# Patient Record
Sex: Male | Born: 1990 | Race: Black or African American | Hispanic: No | Marital: Married | Smoking: Current every day smoker
Health system: Southern US, Community
[De-identification: ages and names within clinical notes are randomized; demographics above are authoritative.]

---

## 2009-03-11 ENCOUNTER — Emergency Department (HOSPITAL_COMMUNITY): Admission: EM | Admit: 2009-03-11 | Discharge: 2009-03-11 | Payer: Self-pay | Admitting: Emergency Medicine

## 2010-10-26 ENCOUNTER — Emergency Department (HOSPITAL_COMMUNITY)
Admission: EM | Admit: 2010-10-26 | Discharge: 2010-10-26 | Disposition: A | Discharge status: Home / Self Care | Payer: No Typology Code available for payment source | Attending: Emergency Medicine | Admitting: Emergency Medicine

## 2010-10-26 ENCOUNTER — Emergency Department (HOSPITAL_COMMUNITY): Payer: No Typology Code available for payment source

## 2010-10-26 DIAGNOSIS — T148XXA Other injury of unspecified body region, initial encounter: Secondary | ICD-10-CM | POA: Insufficient documentation

## 2010-10-26 DIAGNOSIS — R079 Chest pain, unspecified: Secondary | ICD-10-CM | POA: Insufficient documentation

## 2011-06-26 ENCOUNTER — Emergency Department (HOSPITAL_COMMUNITY): Payer: Self-pay

## 2011-06-26 ENCOUNTER — Emergency Department (HOSPITAL_COMMUNITY)
Admission: EM | Admit: 2011-06-26 | Discharge: 2011-06-26 | Disposition: A | Discharge status: Home / Self Care | Payer: Self-pay | Attending: Emergency Medicine | Admitting: Emergency Medicine

## 2011-06-26 ENCOUNTER — Encounter: Payer: Self-pay | Admitting: *Deleted

## 2011-06-26 DIAGNOSIS — S02400A Malar fracture unspecified, initial encounter for closed fracture: Secondary | ICD-10-CM | POA: Insufficient documentation

## 2011-06-26 DIAGNOSIS — S02402A Zygomatic fracture, unspecified, initial encounter for closed fracture: Secondary | ICD-10-CM

## 2011-06-26 DIAGNOSIS — IMO0002 Reserved for concepts with insufficient information to code with codable children: Secondary | ICD-10-CM | POA: Insufficient documentation

## 2011-06-26 DIAGNOSIS — R51 Headache: Secondary | ICD-10-CM | POA: Insufficient documentation

## 2011-06-26 DIAGNOSIS — S02401A Maxillary fracture, unspecified, initial encounter for closed fracture: Secondary | ICD-10-CM | POA: Insufficient documentation

## 2011-06-26 MED ORDER — HYDROCODONE-ACETAMINOPHEN 5-325 MG PO TABS: 2.0000 | ORAL_TABLET | ORAL | Status: AC | PRN

## 2011-06-26 MED ORDER — METHOCARBAMOL 500 MG PO TABS: 500.0000 mg | ORAL_TABLET | Freq: Two times a day (BID) | ORAL | Status: AC

## 2011-06-26 NOTE — ED Notes (Signed)
Pt states "was hit in the face by a baseball yesterday around 1800, it didn't knock me out, I have an indention, I can't eat"; pt presents with bruising under left eye, edema to left jaw, indention visible.

## 2011-06-26 NOTE — ED Provider Notes (Signed)
History     CSN: [id number] Arrival date & time: 06/26/2011  4:21 PM   First MD Initiated Contact with Patient 06/26/11 1722      Chief Complaint  Patient presents with  . Facial Injury    (Consider location/radiation/quality/duration/timing/severity/associated sxs/prior treatment) HPI  History reviewed. No pertinent past medical history.  History reviewed. No pertinent past surgical history.  No family history on file.  History  Substance Use Topics  . Smoking status: Former Games developer  . Smokeless tobacco: Not on file  . Alcohol Use: No      Review of Systems  All other systems reviewed and are negative.    Allergies  Review of patient's allergies indicates no known allergies.  Home Medications   Current Outpatient Rx  Name Route Sig Dispense Refill  . IBUPROFEN 200 MG PO TABS Oral Take 200 mg by mouth every 6 (six) hours as needed.        BP 143/95  Pulse 59  Temp(Src) 98.4 F (36.9 C) (Oral)  Resp 17  Wt 160 lb (72.576 kg)  SpO2 100%  Physical Exam  Nursing note and vitals reviewed. Constitutional: He is oriented to person, place, and time. He appears well-developed and well-nourished.  Non-toxic appearance. No distress.  HENT:  Head:    Eyes: Conjunctivae, EOM and lids are normal. Pupils are equal, round, and reactive to light.  Neck: Normal range of motion. Neck supple. No tracheal deviation present. No mass present.  Cardiovascular: Normal rate, regular rhythm and normal heart sounds.  Exam reveals no gallop.   No murmur heard. Pulmonary/Chest: Effort normal and breath sounds normal. No stridor. No respiratory distress. He has no decreased breath sounds. He has no wheezes. He has no rhonchi. He has no rales.  Abdominal: Soft. Normal appearance and bowel sounds are normal. He exhibits no distension. There is no tenderness. There is no rebound and no CVA tenderness.  Musculoskeletal: Normal range of motion. He exhibits no edema and no tenderness.    Neurological: He is alert and oriented to person, place, and time. He has normal strength. No cranial nerve deficit or sensory deficit. GCS eye subscore is 4. GCS verbal subscore is 5. GCS motor subscore is 6.  Skin: Skin is warm and dry. No abrasion and no rash noted.  Psychiatric: He has a normal mood and affect. His speech is normal and behavior is normal.    ED Course  Procedures (including critical care time)  Labs Reviewed - No data to display No results found.   No diagnosis found.    MDM  The patient's x-rays reviewed and patient given referral to maxillofacial surgeon on call        Toy Baker, MD 06/26/11 1824

## 2011-06-26 NOTE — ED Notes (Signed)
Dr. Patria Mane notified

## 2012-09-04 IMAGING — CT CT MAXILLOFACIAL W/O CM
3 series · 14 of 47 positions shown, 16 images · non-contrast
Comparison: None.

CLINICAL DATA: Left facial injury.  Pain.

CT MAXILLOFACIAL WITHOUT CONTRAST
TECHNIQUE: Multidetector CT imaging of the maxillofacial
structures was performed. Multiplanar CT image reconstructions were
also generated.

[Series 2: facial st · axial · 0.38mm/px · z∈[-524,-388]mm · 8 of 80 slices shown, 10 images]
[im 6/80  brain]
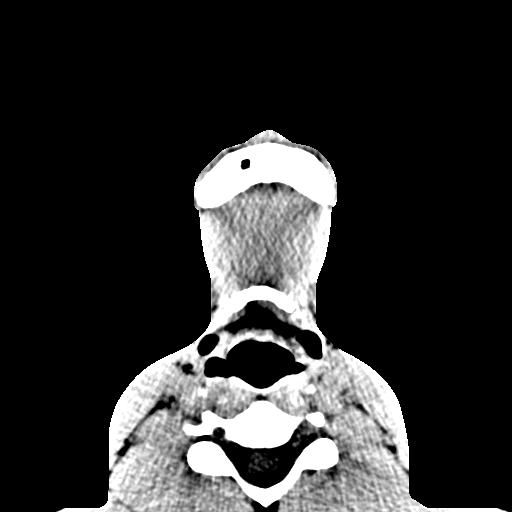
[im 6/80  bone]
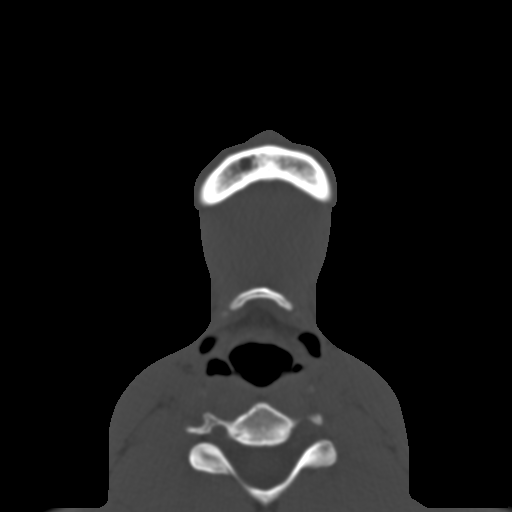
[im 17/80  bone]
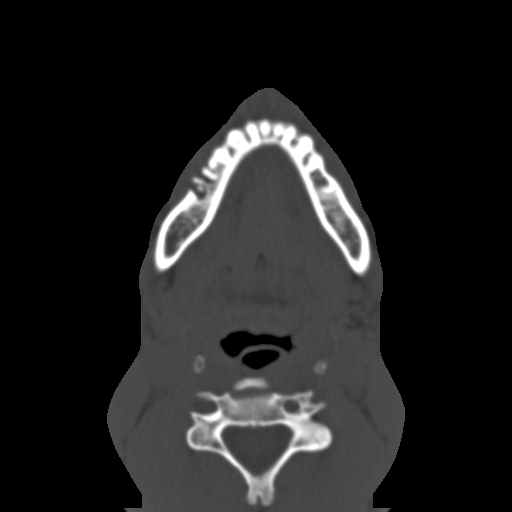
[im 25/80  bone]
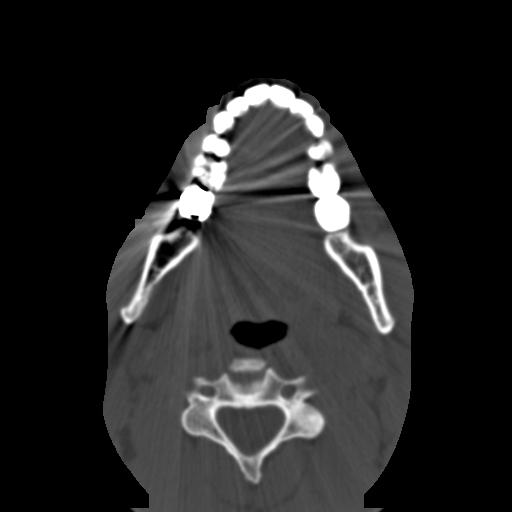
[im 36/80  bone]
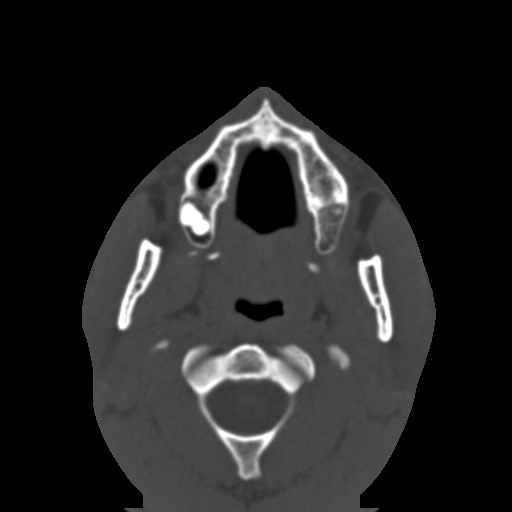
[im 44/80  brain]
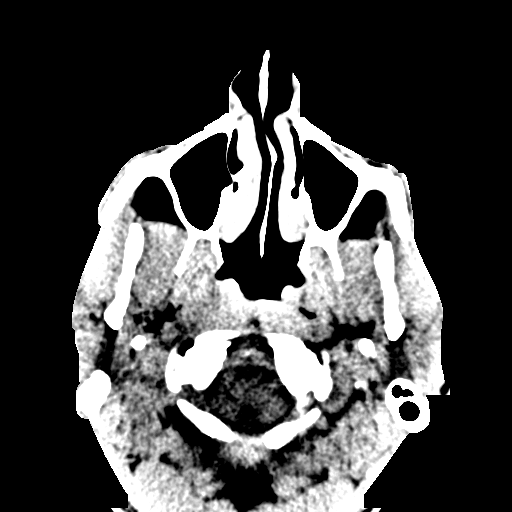
[im 44/80  bone]
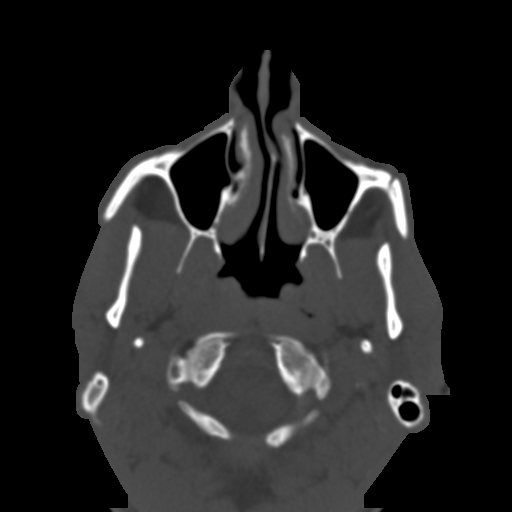
[im 55/80  bone]
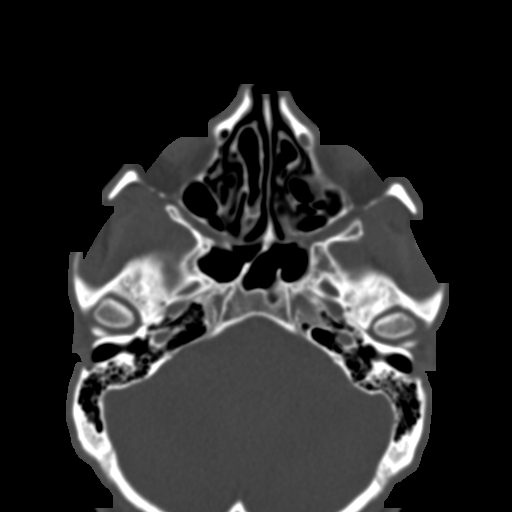
[im 63/80  bone]
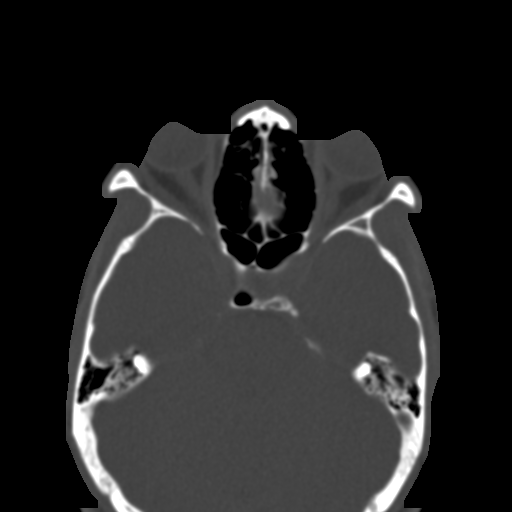
[im 74/80  bone]
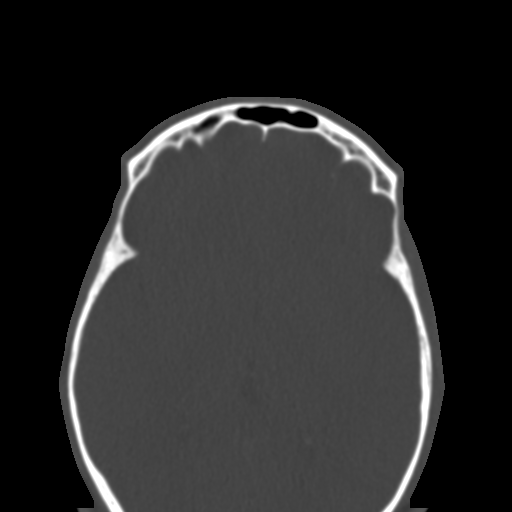

[Series 6: coronal st · coronal · 0.31mm/px · 3 of 76 slices shown]
[im 26/76  bone]
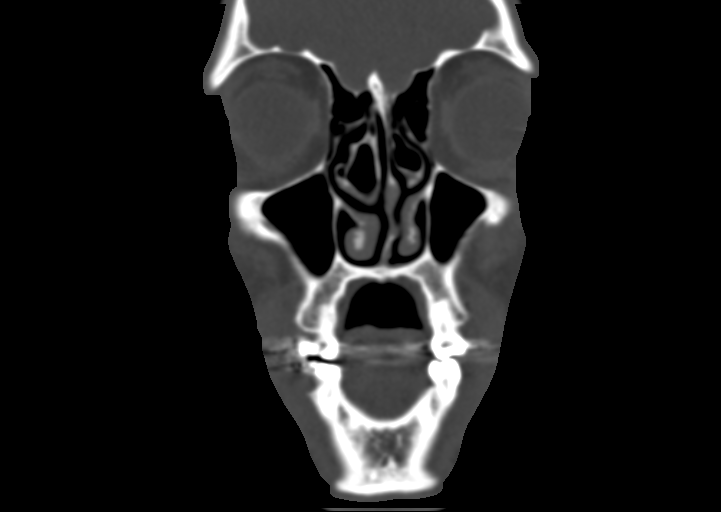
[im 34/76  bone]
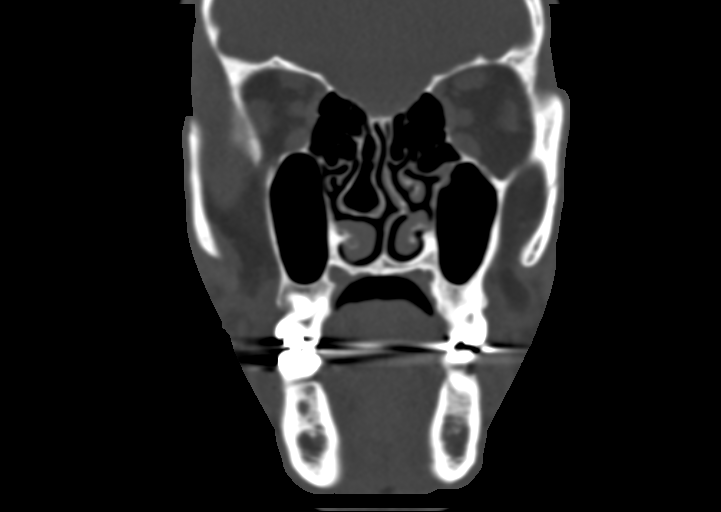
[im 42/76  bone]
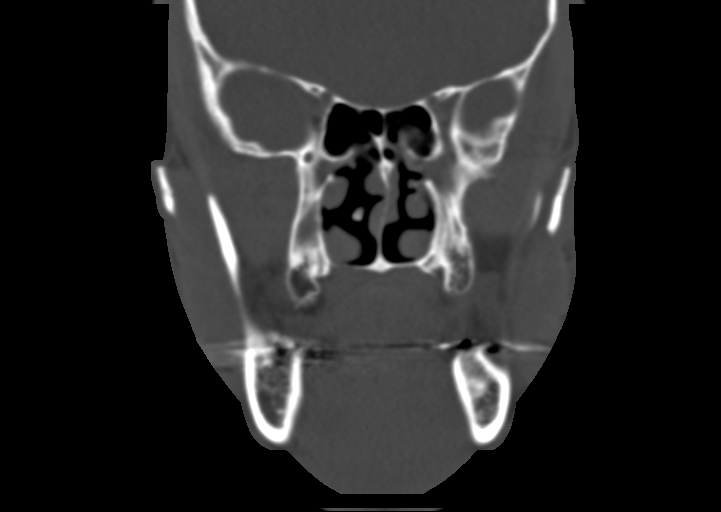

[Series 7: sagittal st · sagittal · 0.31mm/px · 3 of 78 slices shown]
[im 26/78  bone]
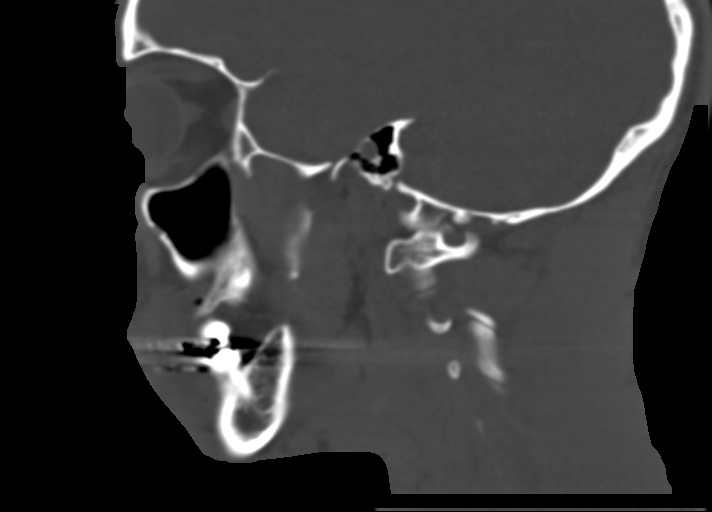
[im 39/78  bone]
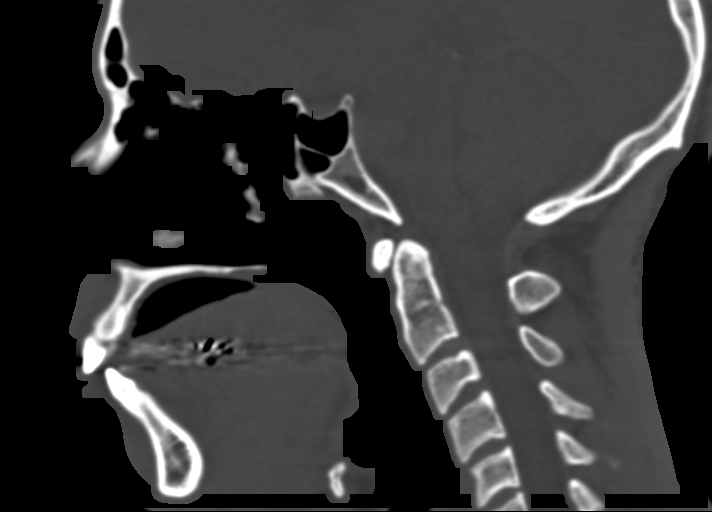
[im 52/78  bone]
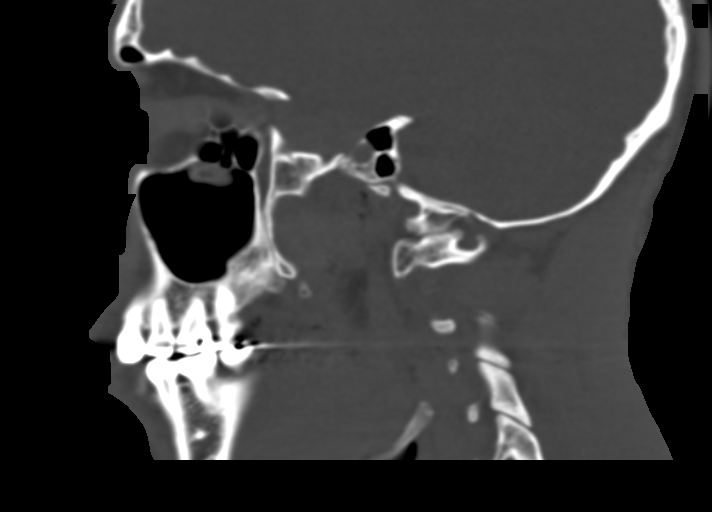

[14 of 47 positions shown; findings below may reference images not displayed]

FINDINGS: There is a mildly comminuted and depressed fracture of
the left zygomatic arch.  There is no associated lateral orbital
fracture.  No other facial fractures are identified.  The paranasal
sinuses are clear without air fluid levels.  The mastoids and
middle ears are clear.  There is no evidence of orbital hematoma.

Mild left facial soft tissue swelling is present.  Bilateral contra
bullosa are noted incidentally.
IMPRESSION: Mildly depressed and comminuted fracture of the left zygomatic arch
as described.  No evidence of associated orbital fracture or
orbital hematoma.

## 2021-07-21 ENCOUNTER — Encounter (HOSPITAL_COMMUNITY): Payer: Self-pay

## 2021-07-21 ENCOUNTER — Ambulatory Visit (HOSPITAL_COMMUNITY)
Admission: EM | Admit: 2021-07-21 | Discharge: 2021-07-21 | Disposition: A | Discharge status: Home / Self Care | Payer: Self-pay | Attending: Student | Admitting: Student

## 2021-07-21 ENCOUNTER — Other Ambulatory Visit: Payer: Self-pay

## 2021-07-21 DIAGNOSIS — J111 Influenza due to unidentified influenza virus with other respiratory manifestations: Secondary | ICD-10-CM

## 2021-07-21 LAB — POC INFLUENZA A AND B ANTIGEN (URGENT CARE ONLY)
INFLUENZA A ANTIGEN, POC: NEGATIVE
INFLUENZA B ANTIGEN, POC: NEGATIVE

## 2021-07-21 MED ORDER — OSELTAMIVIR PHOSPHATE 75 MG PO CAPS: 75.0000 mg | ORAL_CAPSULE | Freq: Two times a day (BID) | ORAL | 0 refills | Status: AC

## 2021-07-21 MED ORDER — ONDANSETRON 8 MG PO TBDP: 8.0000 mg | ORAL_TABLET | Freq: Three times a day (TID) | ORAL | 0 refills | Status: AC | PRN

## 2021-07-21 NOTE — Discharge Instructions (Addendum)
-  Tamiflu twice daily x5 days. This medication can cause nausea, so I also sent nausea medication. You can stop the Tamiflu if you don't like it or if it causes side effects.  -Take the Zofran (ondansetron) up to 3 times daily for nausea and vomiting. -For fevers/chills, bodyaches, headaches- You can take Tylenol up to 1000 mg 3 times daily, and ibuprofen up to 600 mg 3 times daily with food.  You can take these together, or alternate every 3-4 hours. -Drink plenty of water/gatorade and get plenty of rest -With a virus, you're typically contagious for 5-7 days, or as long as you're having fevers.  -Come back and see us if things are getting worse instead of better, like shortness of breath, chest pain, fevers and chills that are getting higher instead of lower and do not come down with Tylenol or ibuprofen, etc.  

## 2021-07-21 NOTE — ED Triage Notes (Signed)
Pt presents with leg pain,chills and fever that started yesterday at work.

## 2021-07-21 NOTE — ED Provider Notes (Signed)
[place]    CSN: [id number] Arrival date & time: 07/21/21  0945      History   Chief Complaint Chief Complaint  Patient presents with   Leg Pain    CHILLS X 2 DAYS.    HPI Samuel Marks is a 30 y.o. male presenting with viral syndrome x1.5 days.  Medical history noncontributory, denies history of pulmonary disease.  Describes generalized myalgias worse over the legs, subjective chills. Denies known sick contacts  Hasn't tried medications at home yet. Generalized bodyaches.  Denies nausea, vomiting, diarrhea, chest pain, dizziness, shortness of breath, weakness.  HPI  History reviewed. No pertinent past medical history.  There are no problems to display for this patient.   History reviewed. No pertinent surgical history.     Home Medications    Prior to Admission medications   Medication Sig Start Date End Date Taking? Authorizing Provider  ondansetron (ZOFRAN-ODT) 8 MG disintegrating tablet Take 1 tablet (8 mg total) by mouth every 8 (eight) hours as needed for nausea or vomiting. 07/21/21  Yes Hazel Sams, PA-C  oseltamivir (TAMIFLU) 75 MG capsule Take 1 capsule (75 mg total) by mouth every 12 (twelve) hours. 07/21/21  Yes Hazel Sams, PA-C  acetaminophen (TYLENOL) 500 MG tablet Take 500 mg by mouth every 6 (six) hours as needed. pain     [provider]    Family History History reviewed. No pertinent family history.  Social History Social History   Tobacco Use   Smoking status: Former  Substance Use Topics   Alcohol use: No   Drug use: Yes    Types: Marijuana     Allergies   Patient has no known allergies.   Review of Systems Review of Systems  Constitutional:  Negative for appetite change, chills and fever.  HENT:  Positive for congestion. Negative for ear pain, rhinorrhea, sinus pressure, sinus pain and sore throat.   Eyes:  Negative for redness and visual disturbance.  Respiratory:  Positive for cough. Negative  for chest tightness, shortness of breath and wheezing.   Cardiovascular:  Negative for chest pain and palpitations.  Gastrointestinal:  Negative for abdominal pain, constipation, diarrhea, nausea and vomiting.  Genitourinary:  Negative for dysuria, frequency and urgency.  Musculoskeletal:  Positive for myalgias.  Neurological:  Negative for dizziness, weakness and headaches.  Psychiatric/Behavioral:  Negative for confusion.   All other systems reviewed and are negative.   Physical Exam Triage Vital Signs ED Triage Vitals  Enc Vitals Group     BP 07/21/21 1037 132/81     Pulse Rate 07/21/21 1037 78     Resp 07/21/21 1037 16     Temp 07/21/21 1037 99.1 F (37.3 C)     Temp Source 07/21/21 1037 Oral     SpO2 07/21/21 1037 100 %     Weight --      Height --      Head Circumference --      Peak Flow --      Pain Score 07/21/21 1038 6     Pain Loc --      Pain Edu? --      Excl. in [place]? --    No data found.  Updated Vital Signs BP 132/81 (BP Location: Left Arm)    Pulse 78    Temp 99.1 F (37.3 C) (Oral)    Resp 16    SpO2 100%   Visual Acuity Right Eye Distance:   Left Eye Distance:  Bilateral Distance:    Right Eye Near:   Left Eye Near:    Bilateral Near:     Physical Exam Vitals reviewed.  Constitutional:      General: He is not in acute distress.    Appearance: Normal appearance. He is not ill-appearing.  HENT:     Head: Normocephalic and atraumatic.     Right Ear: Tympanic membrane, ear canal and external ear normal. No tenderness. No middle ear effusion. There is no impacted cerumen. Tympanic membrane is not perforated, erythematous, retracted or bulging.     Left Ear: Tympanic membrane, ear canal and external ear normal. No tenderness.  No middle ear effusion. There is no impacted cerumen. Tympanic membrane is not perforated, erythematous, retracted or bulging.     Nose: Congestion present.     Mouth/Throat:     Mouth: Mucous membranes are moist.      Pharynx: Uvula midline. No oropharyngeal exudate or posterior oropharyngeal erythema.  Eyes:     Extraocular Movements: Extraocular movements intact.     Pupils: Pupils are equal, round, and reactive to light.  Cardiovascular:     Rate and Rhythm: Normal rate and regular rhythm.     Heart sounds: Normal heart sounds.  Pulmonary:     Effort: Pulmonary effort is normal.     Breath sounds: Normal breath sounds. No decreased breath sounds, wheezing, rhonchi or rales.  Abdominal:     Palpations: Abdomen is soft.     Tenderness: There is no abdominal tenderness. There is no guarding or rebound.  Lymphadenopathy:     Cervical: No cervical adenopathy.     Right cervical: No superficial cervical adenopathy.    Left cervical: No superficial cervical adenopathy.  Neurological:     General: No focal deficit present.     Mental Status: He is alert and oriented to person, place, and time.  Psychiatric:        Mood and Affect: Mood normal.        Behavior: Behavior normal.        Thought Content: Thought content normal.        Judgment: Judgment normal.     UC Treatments / Results  Labs (all labs ordered are listed, but only abnormal results are displayed) Labs Reviewed  POC INFLUENZA A AND B ANTIGEN (URGENT CARE ONLY)  POC INFLUENZA A AND B ANTIGEN (URGENT CARE ONLY)    EKG   Radiology No results found.  Procedures Procedures (including critical care time)  Medications Ordered in UC Medications - No data to display  Initial Impression / Assessment and Plan / UC Course  I have reviewed the triage vital signs and the nursing notes.  Pertinent labs & imaging results that were available during my care of the patient were reviewed by me and considered in my medical decision making (see chart for details).     This patient is a very pleasant 30 y.o. year old male presenting with suspected influenza. Today this pt is afebrile nontachycardic nontachypneic, oxygenating well on room  air, no wheezes rhonchi or rales. No history cardiopulm ds.  Rapid influenza negative, suspect this was a false negative - too early during course of illness . Tamiflu and zofran sent to have on hand. Good hydration.  Work note provided. ED return precautions discussed. Patient verbalizes understanding and agreement.   Final Clinical Impressions(s) / UC Diagnoses   Final diagnoses:  Influenza with respiratory manifestation     Discharge Instructions      -  Tamiflu twice daily x5 days. This medication can cause nausea, so I also sent nausea medication. You can stop the Tamiflu if you don't like it or if it causes side effects.  -Take the Zofran (ondansetron) up to 3 times daily for nausea and vomiting. -For fevers/chills, bodyaches, headaches- You can take Tylenol up to 1000 mg 3 times daily, and ibuprofen up to 600 mg 3 times daily with food.  You can take these together, or alternate every 3-4 hours. -Drink plenty of water/gatorade and get plenty of rest -With a virus, you're typically contagious for 5-7 days, or as long as you're having fevers.  -Come back and see [place] if things are getting worse instead of better, like shortness of breath, chest pain, fevers and chills that are getting higher instead of lower and do not come down with Tylenol or ibuprofen, etc.      ED Prescriptions     Medication Sig Dispense Auth. Provider   oseltamivir (TAMIFLU) 75 MG capsule Take 1 capsule (75 mg total) by mouth every 12 (twelve) hours. 10 capsule Marin Roberts E, PA-C   ondansetron (ZOFRAN-ODT) 8 MG disintegrating tablet Take 1 tablet (8 mg total) by mouth every 8 (eight) hours as needed for nausea or vomiting. 20 tablet Hazel Sams, PA-C      PDMP not reviewed this encounter.   Hazel Sams, PA-C 07/21/21 1115

## 2023-04-24 ENCOUNTER — Emergency Department (HOSPITAL_COMMUNITY): Payer: Medicaid Other

## 2023-04-24 ENCOUNTER — Emergency Department (HOSPITAL_COMMUNITY)
Admission: EM | Admit: 2023-04-24 | Discharge: 2023-04-24 | Discharge status: Against Medical Advice | Payer: Medicaid Other | Attending: Emergency Medicine | Admitting: Emergency Medicine

## 2023-04-24 ENCOUNTER — Other Ambulatory Visit: Payer: Self-pay

## 2023-04-24 ENCOUNTER — Encounter (HOSPITAL_COMMUNITY): Payer: Self-pay

## 2023-04-24 DIAGNOSIS — E876 Hypokalemia: Secondary | ICD-10-CM | POA: Diagnosis not present

## 2023-04-24 DIAGNOSIS — K6289 Other specified diseases of anus and rectum: Secondary | ICD-10-CM | POA: Diagnosis not present

## 2023-04-24 DIAGNOSIS — R9431 Abnormal electrocardiogram [ECG] [EKG]: Secondary | ICD-10-CM | POA: Diagnosis not present

## 2023-04-24 DIAGNOSIS — R0781 Pleurodynia: Secondary | ICD-10-CM | POA: Diagnosis not present

## 2023-04-24 DIAGNOSIS — R0789 Other chest pain: Secondary | ICD-10-CM | POA: Diagnosis not present

## 2023-04-24 LAB — COMPREHENSIVE METABOLIC PANEL
ALT: 18 U/L (ref 0–44)
AST: 20 U/L (ref 15–41)
Albumin: 4 g/dL (ref 3.5–5.0)
Alkaline Phosphatase: 53 U/L (ref 38–126)
Anion gap: 14 (ref 5–15)
BUN: 9 mg/dL (ref 6–20)
CO2: 22 mmol/L (ref 22–32)
Calcium: 8.9 mg/dL (ref 8.9–10.3)
Chloride: 102 mmol/L (ref 98–111)
Creatinine, Ser: 1.15 mg/dL (ref 0.61–1.24)
GFR, Estimated: >60 mL/min (ref >60)
Glucose, Bld: 96 mg/dL (ref 70–99)
Potassium: 3.4 mmol/L — ABNORMAL LOW (ref 3.5–5.1)
Sodium: 138 mmol/L (ref 135–145)
Total Bilirubin: 1.2 mg/dL (ref 0.3–1.2)
Total Protein: 7.2 g/dL (ref 6.5–8.1)

## 2023-04-24 LAB — CBC
HCT: 44.7 % (ref 39.0–52.0)
Hemoglobin: 15 g/dL (ref 13.0–17.0)
MCH: 29.8 pg (ref 26.0–34.0)
MCHC: 33.6 g/dL (ref 30.0–36.0)
MCV: 88.7 fL (ref 80.0–100.0)
Platelets: 199 10*3/uL (ref 150–400)
RBC: 5.04 MIL/uL (ref 4.22–5.81)
RDW: 13.2 % (ref 11.5–15.5)
WBC: 7.7 10*3/uL (ref 4.0–10.5)
nRBC: 0 % (ref 0.0–0.2)

## 2023-04-24 LAB — URINALYSIS, ROUTINE W REFLEX MICROSCOPIC
Bilirubin Urine: NEGATIVE
Glucose, UA: NEGATIVE mg/dL
Hgb urine dipstick: NEGATIVE
Ketones, ur: NEGATIVE mg/dL
Leukocytes,Ua: NEGATIVE
Nitrite: NEGATIVE
Protein, ur: NEGATIVE mg/dL
Specific Gravity, Urine: 1.016 (ref 1.005–1.030)
pH: 5 (ref 5.0–8.0)

## 2023-04-24 LAB — LIPASE, BLOOD: Lipase: 27 U/L (ref 11–51)

## 2023-04-24 NOTE — ED Notes (Signed)
Lab called again for add on labs.

## 2023-04-24 NOTE — ED Triage Notes (Signed)
Pt came in via POV requesting to be checked for cancer d/t his worries about having frequent nose bleeds the last few years, sharp pains in his bottom that shoots into his stomach, & then began having a sharp pain in his Rt side today. Also reports a "lump" felt in his Lt ribs lately (per pt). A/Ox4, rates his pain 7/10.

## 2023-04-24 NOTE — ED Notes (Signed)
Patient left, stating he could not wait any longer, that he needed to go get his child. Refused vital and signing AMA due to being in a rush.

## 2023-04-24 NOTE — ED Provider Notes (Signed)
[facility] AT [facility] Provider Note   CSN: [id number] Arrival date & time: 04/24/23  7829     History  Chief Complaint  Patient presents with   Pain Rt side Torso   Rectal Pain    Samuel Marks is a 32 y.o. male with no significant past medical history who presents to the ED due to multiple chronic complaints concerned about possible cancer.  Patient admits to a lump on left side of ribs.  He notes the lump comes and goes.  Unable to feel area today.  No chest pain or shortness of breath.  No injury to left-sided ribs.  Patient also admits to intermittent rectal pain x 2 years.  No rectal penetration.  Admits to intermittent bloody stool.  Denies abdominal pain, nausea, vomiting, or diarrhea.  Denies constipation.  No fever or chills.  Patient notes he is concerned he may have cancer and is requesting testing for different types of cancer.  Patient also concerned about frequent nosebleeds.  Does not have a PCP.  History obtained from patient and past medical records. No interpreter used during encounter.       Home Medications Prior to Admission medications   Medication Sig Start Date End Date Taking? Authorizing Provider  acetaminophen (TYLENOL) 500 MG tablet Take 500 mg by mouth every 6 (six) hours as needed. pain     [provider]  ondansetron (ZOFRAN-ODT) 8 MG disintegrating tablet Take 1 tablet (8 mg total) by mouth every 8 (eight) hours as needed for nausea or vomiting. 07/21/21   Rhys Martini, PA-C  oseltamivir (TAMIFLU) 75 MG capsule Take 1 capsule (75 mg total) by mouth every 12 (twelve) hours. 07/21/21   Rhys Martini, PA-C      Allergies    Patient has no known allergies.    Review of Systems   Review of Systems  Respiratory:  Negative for shortness of breath.   Cardiovascular:  Negative for chest pain.  Gastrointestinal:  Positive for blood in stool and rectal pain. Negative for abdominal pain, constipation,  diarrhea, nausea and vomiting.    Physical Exam Updated Vital Signs BP (!) 134/119 (BP Location: Right Arm)   Pulse 73   Temp 98.2 F (36.8 C) (Oral)   Resp 16   Ht 6' (1.829 m)   Wt 90.7 kg   SpO2 99%   BMI 27.12 kg/m  Physical Exam Vitals and nursing note reviewed.  Constitutional:      General: He is not in acute distress.    Appearance: He is not ill-appearing.  HENT:     Head: Normocephalic.  Eyes:     Pupils: Pupils are equal, round, and reactive to light.  Cardiovascular:     Rate and Rhythm: Normal rate and regular rhythm.     Pulses: Normal pulses.     Heart sounds: Normal heart sounds. No murmur heard.    No friction rub. No gallop.  Pulmonary:     Effort: Pulmonary effort is normal.     Breath sounds: Normal breath sounds.  Chest:     Comments: No palpable nodules on left side of ribs Abdominal:     General: Abdomen is flat. There is no distension.     Palpations: Abdomen is soft.     Tenderness: There is no abdominal tenderness. There is no guarding or rebound.     Comments: Abdomen soft, nondistended, nontender to palpation in all quadrants without guarding or peritoneal signs. No  rebound.   Genitourinary:    Comments: Patient declined rectal exam Musculoskeletal:        General: Normal range of motion.     Cervical back: Neck supple.  Skin:    General: Skin is warm and dry.  Neurological:     General: No focal deficit present.     Mental Status: He is alert.  Psychiatric:        Mood and Affect: Mood normal.        Behavior: Behavior normal.     ED Results / Procedures / Treatments   Labs (all labs ordered are listed, but only abnormal results are displayed) Labs Reviewed  COMPREHENSIVE METABOLIC PANEL - Abnormal; Notable for the following components:      Result Value   Potassium 3.4 (*)    All other components within normal limits  CBC  URINALYSIS, ROUTINE W REFLEX MICROSCOPIC  LIPASE, BLOOD    EKG None  Radiology No results  found.  Procedures Procedures    Medications Ordered in ED Medications - No data to display  ED Course/ Medical Decision Making/ A&P                                 Medical Decision Making Amount and/or Complexity of Data Reviewed Labs: ordered. Decision-making details documented in ED Course. Radiology: ordered and independent interpretation performed. Decision-making details documented in ED Course. ECG/medicine tests: ordered and independent interpretation performed. Decision-making details documented in ED Course.   32 year old male presents to the ED due to multiple chronic complaints.  Patient notes he would like to be tested for cancer.  Admits to rectal pain x 2 years.  Denies rectal penetration.  Also admits to frequent nosebleeds.  Patient also concerned about a lump on the left side of his ribs.  No chest pain or shortness of breath.  Upon arrival, patient afebrile, not tachycardic or hypoxic.  Patient in no acute distress.  Unable to palpate any nodules on left side of ribs.  Patient states area is not currently present.  Abdomen soft, nondistended, nontender.  Low suspicion for acute abdomen. Lungs clear to auscultation bilaterally.  Offered to perform a rectal exam however, patient declined.  Routine labs ordered at triage.  Added chest x-ray to evaluate for any abnormalities given possible nodule? PERC negative and low risk using wells criteria, low suspicion for PE. Patient will likely need a referral to a PCP for further evaluation of his chronic complaints.  CBC unremarkable.  Normal platelets.  UA negative for signs of infection.  CMP significant for mild hypokalemia 3.4.  Normal renal function.  2:08 PM Informed by RN patient eloped from the ED prior to CXR read.   Lives at home No PCP       Final Clinical Impression(s) / ED Diagnoses Final diagnoses:  Rectal pain  Rib pain    Rx / DC Orders ED Discharge Orders     None         Mannie Stabile,  PA-C 04/24/23 1409    Tegeler, Canary Brim, MD 04/24/23 [phone]

## 2023-04-24 NOTE — ED Notes (Signed)
Called lab to add on lipase, they advised they would.

## 2023-04-24 NOTE — Discharge Instructions (Addendum)
It was a pleasure taking care of you today.  As discussed, all of your labs are reassuring.  You declined a rectal exam for further evaluation of your rectal pain.  I have included the number of [facility].  Call to schedule an appointment to establish care. Return to the ER for new or worsening symptoms.

## 2023-05-27 ENCOUNTER — Ambulatory Visit
Admission: EM | Admit: 2023-05-27 | Discharge: 2023-05-27 | Disposition: A | Discharge status: Home / Self Care | Payer: Medicaid Other | Attending: Internal Medicine | Admitting: Internal Medicine

## 2023-05-27 ENCOUNTER — Encounter: Payer: Self-pay | Admitting: *Deleted

## 2023-05-27 ENCOUNTER — Other Ambulatory Visit: Payer: Self-pay

## 2023-05-27 DIAGNOSIS — K0889 Other specified disorders of teeth and supporting structures: Secondary | ICD-10-CM

## 2023-05-27 DIAGNOSIS — K047 Periapical abscess without sinus: Secondary | ICD-10-CM | POA: Diagnosis not present

## 2023-05-27 MED ORDER — AMOXICILLIN-POT CLAVULANATE 875-125 MG PO TABS: 1.0000 | ORAL_TABLET | Freq: Two times a day (BID) | ORAL | 0 refills | Status: AC

## 2023-05-27 NOTE — ED Provider Notes (Signed)
[facility]    CSN: [id number] Arrival date & time: 05/27/23  0805      History   Chief Complaint Chief Complaint  Patient presents with   Dental Pain    HPI Samuel Marks is a 32 y.o. male.   Patient presents with right upper dental pain that has been present for "a while" but has seemed to worsen over the past 24 to 48 hours.  Reports that he woke up noticing some swelling in that area.  He does an appointment with a dentist tomorrow morning.  Denies fever.  Denies trauma to the area.  Reports that he has "permanent "teeth" in that area.  He has taken Tylenol for pain.   Dental Pain   History reviewed. No pertinent past medical history.  There are no problems to display for this patient.   History reviewed. No pertinent surgical history.     Home Medications    Prior to Admission medications   Medication Sig Start Date End Date Taking? Authorizing Provider  amoxicillin-clavulanate (AUGMENTIN) 875-125 MG tablet Take 1 tablet by mouth every 12 (twelve) hours. 05/27/23  Yes Dalylah Ramey, Rolly Salter E, FNP  acetaminophen (TYLENOL) 500 MG tablet Take 500 mg by mouth every 6 (six) hours as needed. pain     [provider]  ondansetron (ZOFRAN-ODT) 8 MG disintegrating tablet Take 1 tablet (8 mg total) by mouth every 8 (eight) hours as needed for nausea or vomiting. 07/21/21   Rhys Martini, PA-C  oseltamivir (TAMIFLU) 75 MG capsule Take 1 capsule (75 mg total) by mouth every 12 (twelve) hours. 07/21/21   Rhys Martini, PA-C    Family History History reviewed. No pertinent family history.  Social History Social History   Tobacco Use   Smoking status: Every Day    Types: Cigarettes  Vaping Use   Vaping status: Never Used  Substance Use Topics   Alcohol use: No   Drug use: Not Currently    Types: Marijuana     Allergies   Patient has no known allergies.   Review of Systems Review of Systems Per HPI  Physical Exam Triage Vital Signs ED  Triage Vitals  Encounter Vitals Group     BP 05/27/23 0822 124/75     Systolic BP Percentile --      Diastolic BP Percentile --      Pulse Rate 05/27/23 0822 66     Resp 05/27/23 0822 18     Temp 05/27/23 0822 98.4 F (36.9 C)     Temp Source 05/27/23 0822 Oral     SpO2 05/27/23 0822 98 %     Weight --      Height --      Head Circumference --      Peak Flow --      Pain Score 05/27/23 0823 5     Pain Loc --      Pain Education --      Exclude from Growth Chart --    No data found.  Updated Vital Signs BP 124/75 (BP Location: Left Arm)   Pulse 66   Temp 98.4 F (36.9 C) (Oral)   Resp 18   SpO2 98%   Visual Acuity Right Eye Distance:   Left Eye Distance:   Bilateral Distance:    Right Eye Near:   Left Eye Near:    Bilateral Near:     Physical Exam Constitutional:      General: He is not in acute  distress.    Appearance: Normal appearance. He is not toxic-appearing or diaphoretic.  HENT:     Head: Normocephalic and atraumatic.     Mouth/Throat:      Comments: Patient has various areas of gold teeth.  There is gingival swelling and erythema surrounding the right upper dentition.  No visible abscess noted. Eyes:     Extraocular Movements: Extraocular movements intact.     Conjunctiva/sclera: Conjunctivae normal.  Pulmonary:     Effort: Pulmonary effort is normal.  Neurological:     General: No focal deficit present.     Mental Status: He is alert and oriented to person, place, and time. Mental status is at baseline.  Psychiatric:        Mood and Affect: Mood normal.        Behavior: Behavior normal.        Thought Content: Thought content normal.        Judgment: Judgment normal.      UC Treatments / Results  Labs (all labs ordered are listed, but only abnormal results are displayed) Labs Reviewed - No data to display  EKG   Radiology No results found.  Procedures Procedures (including critical care time)  Medications Ordered in  UC Medications - No data to display  Initial Impression / Assessment and Plan / UC Course  I have reviewed the triage vital signs and the nursing notes.  Pertinent labs & imaging results that were available during my care of the patient were reviewed by me and considered in my medical decision making (see chart for details).     Will treat dental infection with Augmentin antibiotic.  Advised safe over-the-counter pain relievers.  Advised patient to keep appointment with dentist for follow-up.  Advised strict follow-up precautions.  Patient verbalized understanding and was agreeable with plan. Final Clinical Impressions(s) / UC Diagnoses   Final diagnoses:  Dental infection  Pain, dental     Discharge Instructions      I have prescribed you an antibiotic for dental infection.  Please keep scheduled appointment with dentist for further evaluation and management.    ED Prescriptions     Medication Sig Dispense Auth. Provider   amoxicillin-clavulanate (AUGMENTIN) 875-125 MG tablet Take 1 tablet by mouth every 12 (twelve) hours. 14 tablet [place], Acie Fredrickson, [place]      PDMP not reviewed this encounter.   Gustavus Bryant, [place] 05/27/23 [phone]

## 2023-05-27 NOTE — Discharge Instructions (Signed)
I have prescribed you an antibiotic for dental infection.  Please keep scheduled appointment with dentist for further evaluation and management.

## 2023-05-27 NOTE — ED Triage Notes (Signed)
Pt has appt to pull teeth tomorrow (right upper). He has had pain with these teeth last night and facial swelling this morning

## 2024-02-08 ENCOUNTER — Ambulatory Visit
Admission: EM | Admit: 2024-02-08 | Discharge: 2024-02-08 | Disposition: A | Discharge status: Home / Self Care | Attending: Family Medicine | Admitting: Family Medicine

## 2024-02-08 DIAGNOSIS — Z711 Person with feared health complaint in whom no diagnosis is made: Secondary | ICD-10-CM

## 2024-02-08 DIAGNOSIS — R6889 Other general symptoms and signs: Secondary | ICD-10-CM

## 2024-02-08 DIAGNOSIS — R39198 Other difficulties with micturition: Secondary | ICD-10-CM

## 2024-02-08 DIAGNOSIS — R109 Unspecified abdominal pain: Secondary | ICD-10-CM | POA: Diagnosis not present

## 2024-02-08 LAB — POCT URINALYSIS DIP (MANUAL ENTRY)
Bilirubin, UA: NEGATIVE
Blood, UA: NEGATIVE
Glucose, UA: NEGATIVE mg/dL
Ketones, POC UA: NEGATIVE mg/dL
Leukocytes, UA: NEGATIVE
Nitrite, UA: NEGATIVE
Protein Ur, POC: NEGATIVE mg/dL
Spec Grav, UA: 1.025 (ref 1.010–1.025)
Urobilinogen, UA: 0.2 U/dL
pH, UA: 6.5 (ref 5.0–8.0)

## 2024-02-08 MED ORDER — SIMETHICONE 62.5 MG PO STRP: 1.0000 | ORAL_STRIP | Freq: Three times a day (TID) | ORAL | 0 refills | Status: AC | PRN

## 2024-02-08 NOTE — ED Triage Notes (Signed)
 I think I am going through prostate cancer, this has been about 3 yrs, my stomach pain has moved to my back and the pain is sharp at times through my rectum, from their on/off but more persistent the last 2-3 days. No PCP yet (discussing and assisting patient).

## 2024-02-08 NOTE — ED Provider Notes (Signed)
 [facility]    CSN: [id number] Arrival date & time: 02/08/24  1125      History   Chief Complaint Chief Complaint  Patient presents with   UTI Symptoms   Rectal Problems   Abdominal Pain    HPI Samuel Marks is a 33 y.o. male.  Patient here today with multiple complaints and concerns around possibly having prostate cancer.  Patient is here with concerns for abdominal pain that shoots into his rectum this been going on for 3 years.  He is also concerned that he has noted streaks of blood, and from his urine and semen after sexual intercourse.  He has not had any associated nausea, vomiting or unexplained weight loss or changes to appetite.  He was seen for this at the emergency department and had a complete workup including blood work with recommendations to establish with a primary care doctor back in October 2024.  He has yet established with a primary care doctor but is receptive to obtain an appointment with a PCP or assistance here today.  He has not experienced any shortness of breath, chest pain, but endorses he has been reading up on his symptoms and is overwhelmingly concerned that he has prostate cancer.  History reviewed. No pertinent past medical history.  There are no active problems to display for this patient.   History reviewed. No pertinent surgical history.     Home Medications    Prior to Admission medications   Medication Sig Start Date End Date Taking? Authorizing Provider  Simethicone 62.5 MG STRP Take 1 strip (62.5 mg total) by mouth 3 (three) times daily with meals as needed (abdominal bloating and pain). 02/08/24  Yes Arloa Suzen RAMAN, NP  acetaminophen  (TYLENOL ) 500 MG tablet Take 500 mg by mouth every 6 (six) hours as needed. pain     [provider]  amoxicillin -clavulanate (AUGMENTIN ) 875-125 MG tablet Take 1 tablet by mouth every 12 (twelve) hours. 05/27/23   Hazen Darryle BRAVO, FNP  ondansetron  (ZOFRAN -ODT) 8 MG disintegrating  tablet Take 1 tablet (8 mg total) by mouth every 8 (eight) hours as needed for nausea or vomiting. 07/21/21   Graham, Laura E, PA-C  oseltamivir  (TAMIFLU ) 75 MG capsule Take 1 capsule (75 mg total) by mouth every 12 (twelve) hours. 07/21/21   Arlyss Leita BRAVO, PA-C    Family History History reviewed. No pertinent family history.  Social History Social History   Tobacco Use   Smoking status: Every Day    Types: Cigarettes  Vaping Use   Vaping status: Never Used  Substance Use Topics   Alcohol use: No   Drug use: Yes    Types: Marijuana     Allergies   Patient has no known allergies.   Review of Systems Review of Systems  Gastrointestinal:  Positive for abdominal pain.     Physical Exam Triage Vital Signs ED Triage Vitals  Encounter Vitals Group     BP 02/08/24 1135 114/70     Girls Systolic BP Percentile --      Girls Diastolic BP Percentile --      Boys Systolic BP Percentile --      Boys Diastolic BP Percentile --      Pulse Rate 02/08/24 1135 71     Resp 02/08/24 1135 18     Temp 02/08/24 1135 98.6 F (37 C)     Temp Source 02/08/24 1135 Oral     SpO2 02/08/24 1135 96 %  Weight 02/08/24 1133 175 lb (79.4 kg)     Height 02/08/24 1133 6' (1.829 m)     Head Circumference --      Peak Flow --      Pain Score 02/08/24 1133 5     Pain Loc --      Pain Education --      Exclude from Growth Chart --    No data found.  Updated Vital Signs BP 114/70 (BP Location: Left Arm)   Pulse 71   Temp 98.6 F (37 C) (Oral)   Resp 18   Ht 6' (1.829 m)   Wt 175 lb (79.4 kg)   SpO2 96%   BMI 23.73 kg/m   Visual Acuity Right Eye Distance:   Left Eye Distance:   Bilateral Distance:    Right Eye Near:   Left Eye Near:    Bilateral Near:     Physical Exam Vitals reviewed.  Constitutional:      Appearance: He is well-developed.  HENT:     Head: Normocephalic and atraumatic.  Eyes:     Extraocular Movements: Extraocular movements intact.     Pupils: Pupils  are equal, round, and reactive to light.  Cardiovascular:     Rate and Rhythm: Normal rate and regular rhythm.  Pulmonary:     Effort: Pulmonary effort is normal.     Breath sounds: Normal breath sounds.  Abdominal:     General: Abdomen is flat. Bowel sounds are normal.     Palpations: Abdomen is soft.     Tenderness: There is no abdominal tenderness.  Genitourinary:    Comments: Deferred to PCP appt in September Skin:    General: Skin is warm.     Capillary Refill: Capillary refill takes less than 2 seconds.  Neurological:     General: No focal deficit present.     Mental Status: He is alert.      UC Treatments / Results  Labs (all labs ordered are listed, but only abnormal results are displayed) Labs Reviewed  POCT URINALYSIS DIP (MANUAL ENTRY) - Normal    EKG   Radiology No results found.  Procedures Procedures (including critical care time)  Medications Ordered in UC Medications - No data to display  Initial Impression / Assessment and Plan / UC Course  I have reviewed the triage vital signs and the nursing notes.  Pertinent labs & imaging results that were available during my care of the patient were reviewed by me and considered in my medical decision making (see chart for details).     Urinary dysfunction explained to patient that the inspects of blood following sexual intercourse from same and is not an abnormal finding that requires any treatment at this time.  Was reassuring his UA is unremarkable and shows no evidence of blood.  Deferred any labs or GU workup as patient is now scheduled to establish with a primary care provider for a complete physical.  Given patient's symptoms of abdominal cramping and pain shooting into his rectum discussed that this could be related to indigestion symptoms and possible gas recommended a trial of Gas-X.  ER if any symptoms become severe.  Patient verbalized understanding agreement with plan today. Final Clinical  Impressions(s) / UC Diagnoses   Final diagnoses:  Urinary dysfunction  Multiple somatic complaints  Abdominal pain, unspecified abdominal location  Concern about cancer without diagnosis     Discharge Instructions      Your complaints today are chronic in nature and  will require a full and complete workup by primary care doctor which you have been established to see on September 8. You do not have a urinary tract infection.  Suspect  the pain you are having in your abdomen and rectum is likely related to bloating and gas.   I have prescribed she simethicone strips he may take 1 stripper 8 hours as needed for abdominal pain to see if this alleviate some of the abdominal symptoms and rectal symptoms you are experiencing. No additional treatment or recommendations indicated today.    ED Prescriptions     Medication Sig Dispense Auth. Provider   Simethicone 62.5 MG STRP Take 1 strip (62.5 mg total) by mouth 3 (three) times daily with meals as needed (abdominal bloating and pain). 120 strip Arloa Suzen RAMAN, NP      PDMP not reviewed this encounter.   Arloa Suzen RAMAN, NP 02/08/24 1316

## 2024-02-08 NOTE — Discharge Instructions (Addendum)
 Your complaints today are chronic in nature and will require a full and complete workup by primary care doctor which you have been established to see on September 8. You do not have a urinary tract infection.  Suspect  the pain you are having in your abdomen and rectum is likely related to bloating and gas.   I have prescribed she simethicone strips he may take 1 stripper 8 hours as needed for abdominal pain to see if this alleviate some of the abdominal symptoms and rectal symptoms you are experiencing. No additional treatment or recommendations indicated today.

## 2024-03-21 ENCOUNTER — Telehealth: Payer: Self-pay | Admitting: Internal Medicine

## 2024-03-21 ENCOUNTER — Encounter: Admitting: Family

## 2024-03-21 NOTE — Telephone Encounter (Signed)
 Called pt and left vm to call office back to reschedule NP appt at [facility]

## 2024-03-21 NOTE — Progress Notes (Signed)
 Erroneous encounter-disregard

## 2024-03-31 ENCOUNTER — Ambulatory Visit: Admitting: Family
